# Patient Record
Sex: Female | Born: 2000 | Race: White | Hispanic: No | Marital: Single | Smoking: Never smoker
Health system: Southern US, Community
[De-identification: ages and names within clinical notes are randomized; demographics above are authoritative.]

## PROBLEM LIST (undated history)

## (undated) DIAGNOSIS — K5090 Crohn's disease, unspecified, without complications: Secondary | ICD-10-CM

---
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
## 2021-07-09 IMAGING — CT CT ABD-PELV W/ CM
2 of 4 series · 16 of 46 positions shown, 18 images · IV contrast (Omnipaque)
Comparison: None.

CLINICAL DATA: Blood in stool

EXAM:
CT ABDOMEN AND PELVIS WITH CONTRAST
TECHNIQUE: Multidetector CT imaging of the abdomen and pelvis was performed
using the standard protocol following bolus administration of
intravenous contrast.
CONTRAST:  100mL OMNIPAQUE IOHEXOL 300 MG/ML  SOLN

[Series 2: axial st · axial · 0.80mm/px · z∈[-466,-26]mm · 13 of 98 slices shown, 15 images]
[im 5/98  soft-tissue]
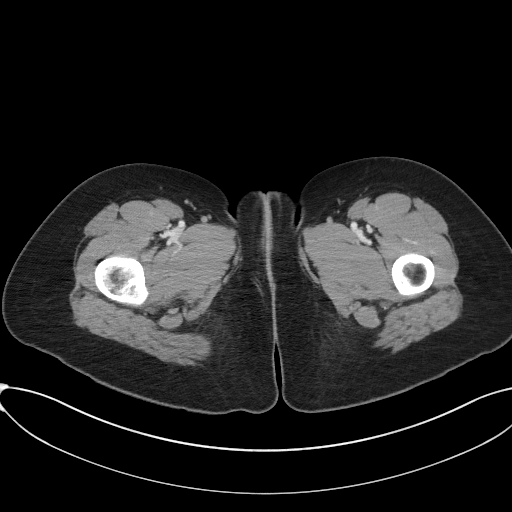
[im 5/98  bone]
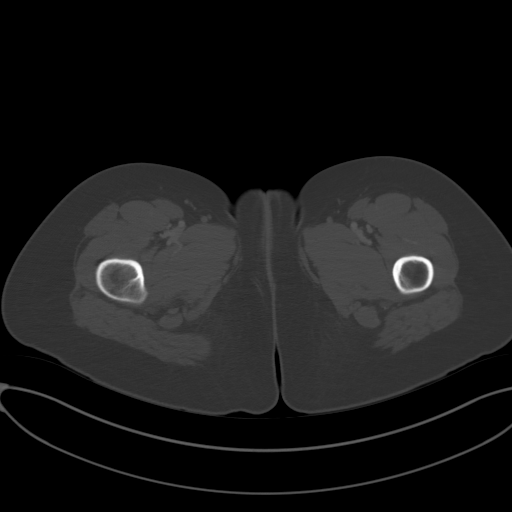
[im 13/98  soft-tissue]
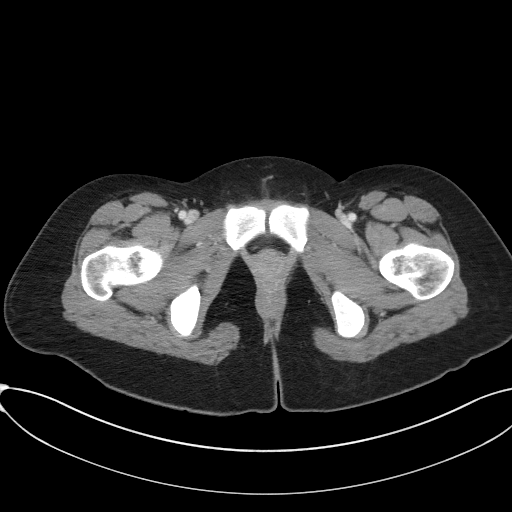
[im 22/98  soft-tissue]
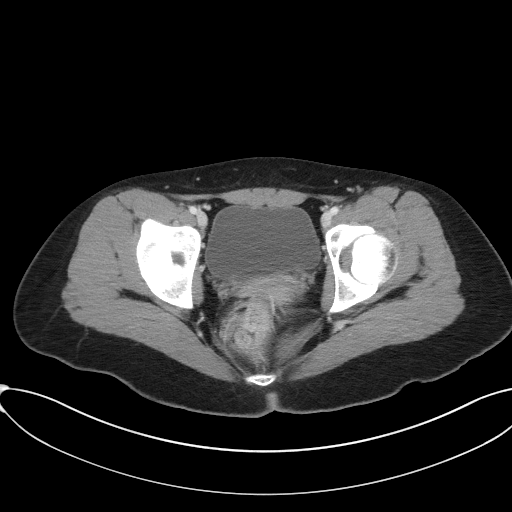
[im 26/98  soft-tissue]
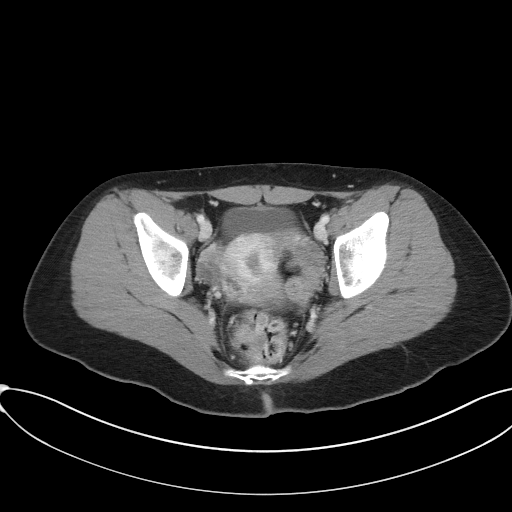
[im 34/98  soft-tissue]
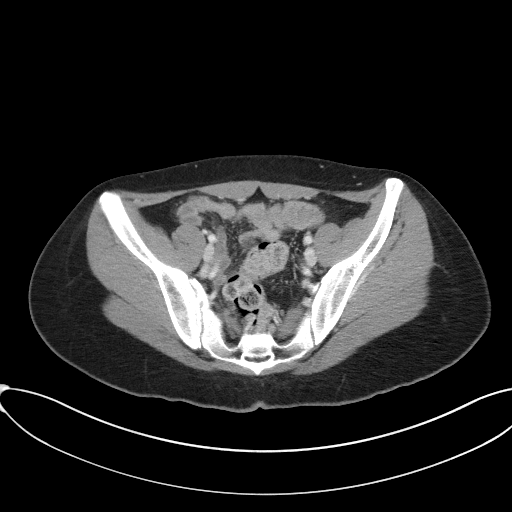
[im 43/98  soft-tissue]
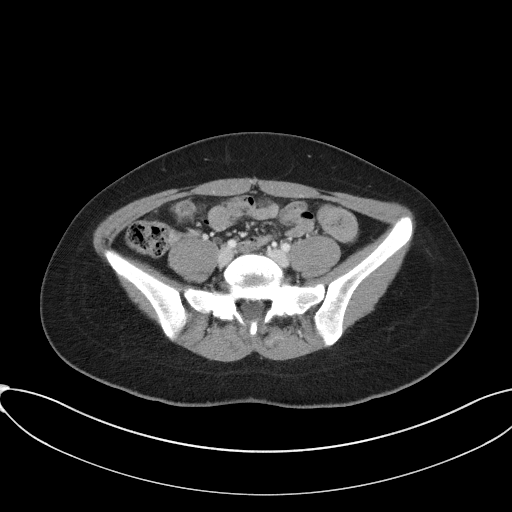
[im 51/98  soft-tissue]
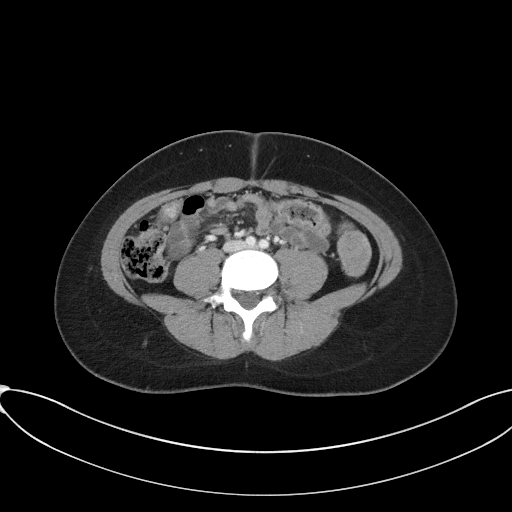
[im 55/98  soft-tissue]
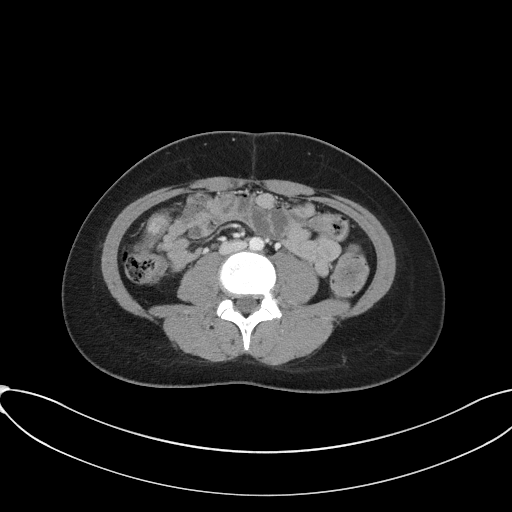
[im 64/98  soft-tissue]
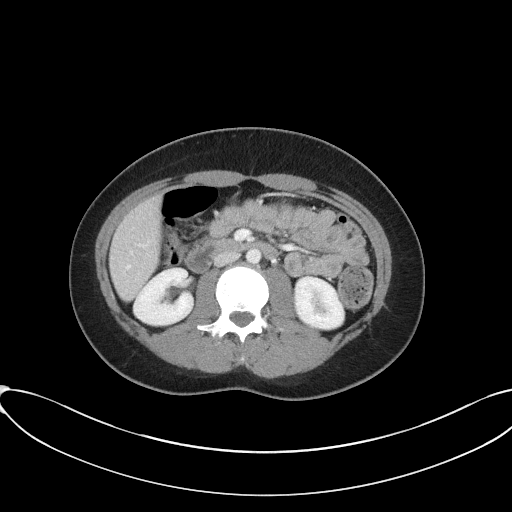
[im 64/98  bone]
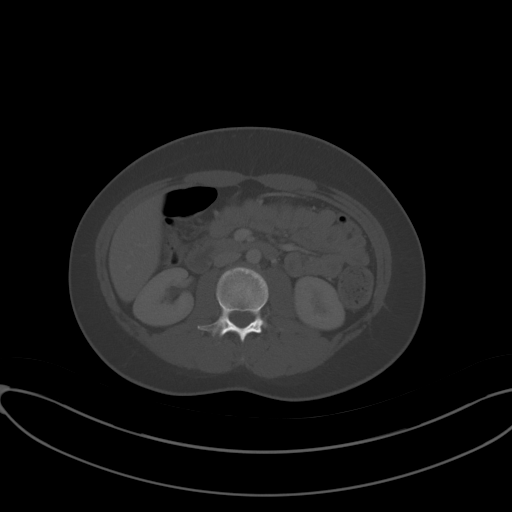
[im 72/98  soft-tissue]
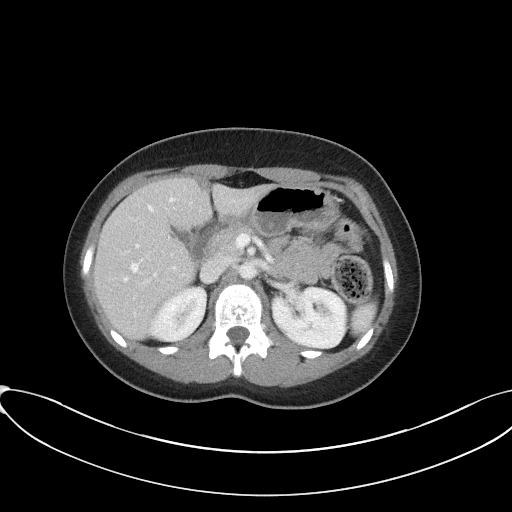
[im 76/98  soft-tissue]
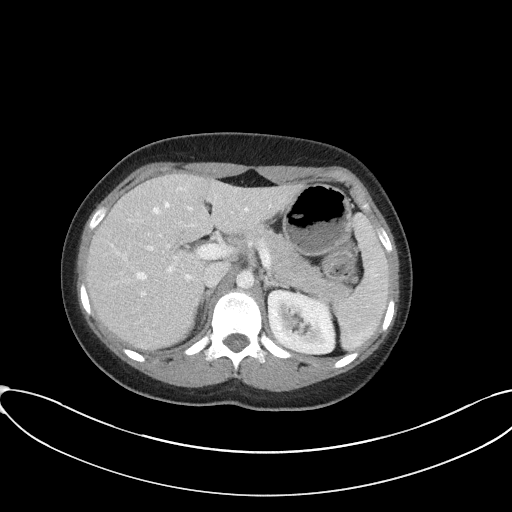
[im 85/98  soft-tissue]
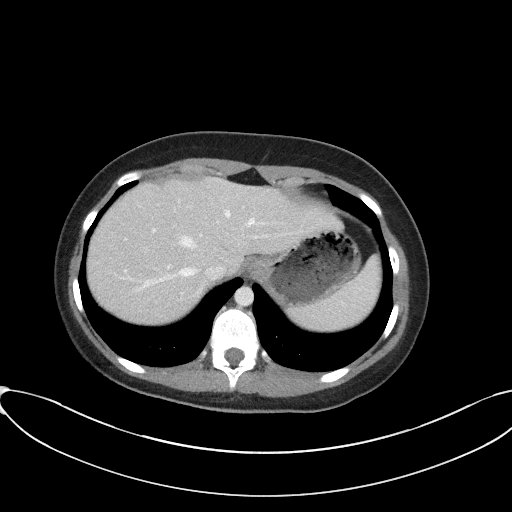
[im 93/98  soft-tissue]
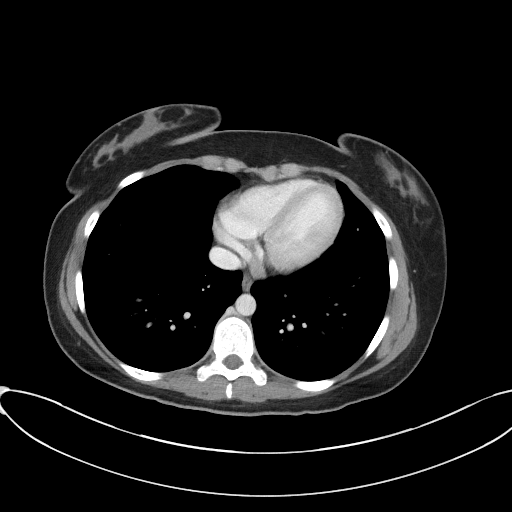

[Series 5: coronal st · coronal · 0.75mm/px · 3 of 77 slices shown]
[im 26/77  soft-tissue]
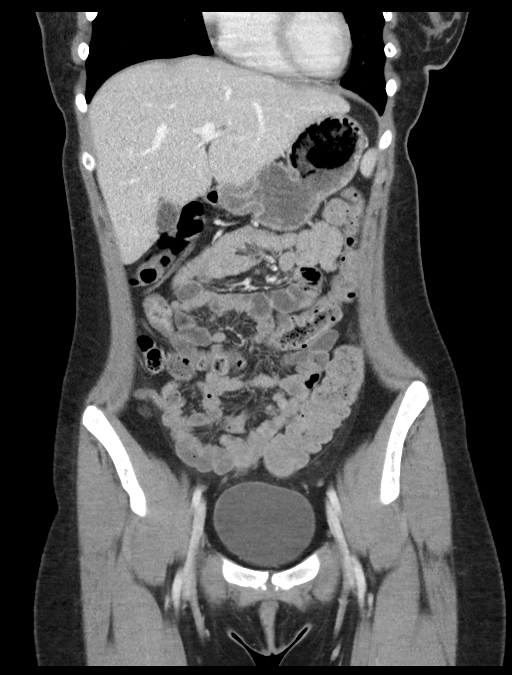
[im 34/77  soft-tissue]
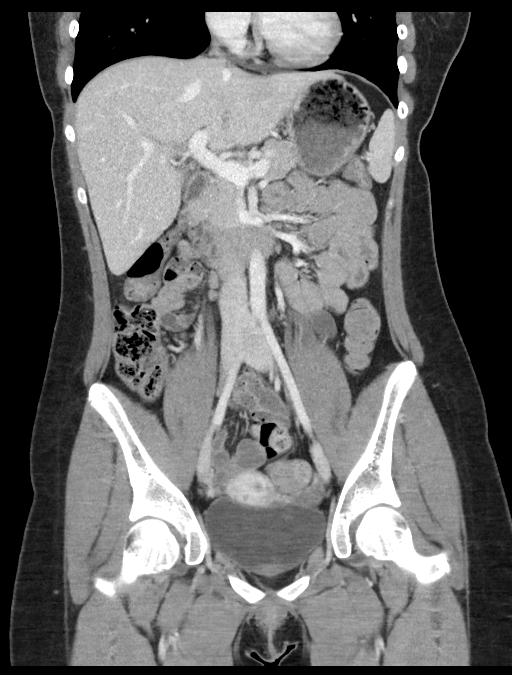
[im 43/77  soft-tissue]
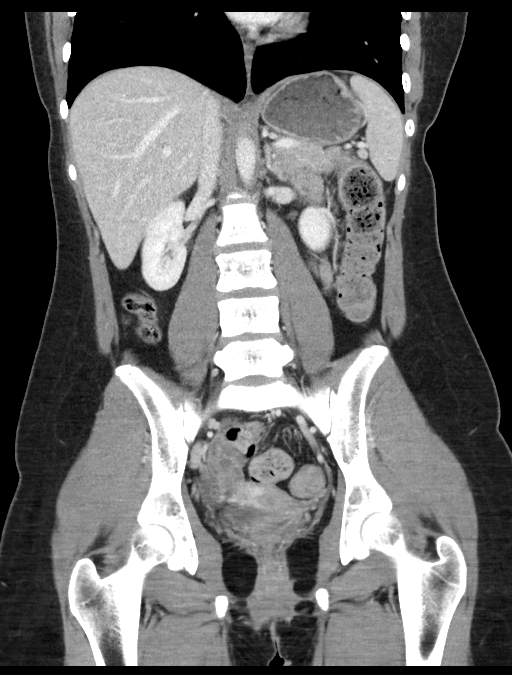

[16 of 46 positions shown; findings below may reference images not displayed]

FINDINGS: Lower chest: Lung bases demonstrate no acute consolidation or
effusion. The heart size is within normal limits.

Hepatobiliary: Focal fat infiltration near the falciform ligament.
No calcified gallstone or biliary dilatation

Pancreas: Unremarkable. No pancreatic ductal dilatation or
surrounding inflammatory changes.

Spleen: Normal in size without focal abnormality.

Adrenals/Urinary Tract: Adrenal glands are unremarkable. Kidneys are
normal, without renal calculi, focal lesion, or hydronephrosis.
Bladder is unremarkable.

Stomach/Bowel: The stomach is nonenlarged. No dilated small bowel.
Questionable areas mild colon wall thickening and mucosal
enhancement at the proximal transverse colon and splenic flexure.
Equivocal appearance of appendix. Appendix measures up to 8 mm in
size with some mucosal enhancement noted but no definitive
inflammatory changes or fluid collection.

Vascular/Lymphatic: No significant vascular findings are present. No
enlarged abdominal or pelvic lymph nodes.

Reproductive: Uterus and bilateral adnexa are unremarkable.

Other: Negative for free air or free fluid.

Musculoskeletal: No acute or significant osseous findings.
IMPRESSION: 1. Questionable mild colitis type changes at the proximal transverse
colon and splenic flexure.
2. Equivocal appearance of the appendix. Appendix is borderline
enlarged with some mucosal enhancement but without other definitive
signs for surrounding inflammatory change/appendicitis. Correlate
with appropriate laboratory values and clinical presentation.

## 2023-12-14 ENCOUNTER — Other Ambulatory Visit (HOSPITAL_BASED_OUTPATIENT_CLINIC_OR_DEPARTMENT_OTHER): Payer: Self-pay

## 2023-12-14 MED ORDER — ESCITALOPRAM OXALATE 10 MG PO TABS
10.0000 mg | ORAL_TABLET | Freq: Every day | ORAL | 1 refills | Status: AC
  Filled 2023-12-14: qty 30, 30d supply, fill #0
  Filled 2024-01-03 – 2024-01-04 (×3): qty 30, 30d supply, fill #1

## 2023-12-17 ENCOUNTER — Ambulatory Visit
Admission: EM | Admit: 2023-12-17 | Discharge: 2023-12-17 | Disposition: A | Discharge status: Home / Self Care | Attending: Family Medicine | Admitting: Family Medicine

## 2023-12-17 ENCOUNTER — Encounter (HOSPITAL_BASED_OUTPATIENT_CLINIC_OR_DEPARTMENT_OTHER): Payer: Self-pay | Admitting: Emergency Medicine

## 2023-12-17 ENCOUNTER — Other Ambulatory Visit: Payer: Self-pay

## 2023-12-17 ENCOUNTER — Emergency Department (HOSPITAL_BASED_OUTPATIENT_CLINIC_OR_DEPARTMENT_OTHER): Admission: EM | Admit: 2023-12-17 | Discharge: 2023-12-17 | Disposition: A | Discharge status: Home / Self Care

## 2023-12-17 DIAGNOSIS — H938X2 Other specified disorders of left ear: Secondary | ICD-10-CM | POA: Diagnosis present

## 2023-12-17 DIAGNOSIS — H6992 Unspecified Eustachian tube disorder, left ear: Secondary | ICD-10-CM

## 2023-12-17 MED ORDER — FLUTICASONE PROPIONATE 50 MCG/ACT NA SUSP: 1.0000 | Freq: Every day | NASAL | 0 refills | Status: AC

## 2023-12-17 MED ORDER — CETIRIZINE HCL 10 MG PO TABS: 10.0000 mg | ORAL_TABLET | Freq: Every day | ORAL | 0 refills | Status: AC

## 2023-12-17 NOTE — Discharge Instructions (Addendum)
 Start Zyrtec daily for 7 to 14 days.  You may use Flonase daily as well.  Please follow-up with your PCP or ear nose and throat if your symptoms do not improve.  Please go to the ER for any worsening symptoms.  Hope you feel better soon!

## 2023-12-17 NOTE — ED Triage Notes (Signed)
 Left ear fullness and discomfort since march.  Pt has attempted to clean her ear and has gone to UC but symptoms continue.

## 2023-12-17 NOTE — ED Provider Notes (Signed)
 [facility] Provider Note   CSN: [id number] Arrival date & time: 12/17/23  1451     History  Chief Complaint  Patient presents with   Ear Fullness    Erica Hickman is a 23 y.o. female.  23 year old female with no reported past medical history presenting to the emergency department today with ear fullness in her left ear.  The patient states that this has been going on now for the past few weeks.  She states that she did use a Q-tip to clean her ears a few weeks ago and states that she started having this full sensation afterwards.  States that she does have some muffled hearing on that side as well.  She came to the ER at that time for further evaluation.  She denies any fevers.  She went to urgent care earlier and was prescribed allergy medications.  Who is coming here for further evaluation.  She does not live in the area and is visiting for the weekend.  Is planning on home on Monday.   Ear Fullness       Home Medications Prior to Admission medications   Medication Sig Start Date End Date Taking? Authorizing Provider  cetirizine (ZYRTEC) 10 MG tablet Take 1 tablet (10 mg total) by mouth daily. 12/17/23   Mayer, Jodi R, NP  ciprofloxacin  (CIPRO ) 500 MG tablet Take 1 tablet (500 mg total) by mouth every 12 (twelve) hours. 10/30/19   Layden, Lindsey A, PA-C  diphenhydrAMINE  (BENADRYL ) 25 MG tablet Take 1 to 2 tablets by mouth every 6 hours as needed for itching. 11/15/20   Wynetta Heckle, MD  escitalopram (LEXAPRO) 10 MG tablet Take by mouth daily.    [provider]  escitalopram (LEXAPRO) 10 MG tablet Take 1 tablet (10 mg total) by mouth daily. 12/13/23     fluticasone (FLONASE) 50 MCG/ACT nasal spray Place 1 spray into both nostrils daily. 12/17/23   Mayer, Jodi R, NP  metroNIDAZOLE  (FLAGYL ) 500 MG tablet Take 1 tablet (500 mg total) by mouth 2 (two) times daily. 10/30/19   Layden, Lindsey A, PA-C  omeprazole (PRILOSEC) 40 MG  capsule Take 40 mg by mouth daily.    [provider]  predniSONE  (DELTASONE ) 20 MG tablet Take 3 tablets by mouth daily for 3 days, then 2 tablets for 3 days, then 1 & 1/2 tablets for 3 days, then 1 tablet for 3 days, then 1/2 tablet x 3 days 11/15/20   Wynetta Heckle, MD  STELARA 90 MG/ML SOSY injection Inject into the skin. 10/31/20   [provider]      Allergies    Penicillins    Review of Systems   Review of Systems  HENT:         Ear fullness    Physical Exam Updated Vital Signs BP (!) 127/95 (BP Location: Left Arm)   Pulse 79   Temp 98.2 F (36.8 C) (Oral)   Resp 18   Ht 5\' 6"  (1.676 m)   Wt 56.7 kg   LMP 12/11/2023   SpO2 100%   BMI 20.18 kg/m  Physical Exam Vitals and nursing note reviewed.   Gen: NAD HEENT: Appearing TMs bilaterally with exception of questionable scarring noted over the inferior aspect of the left TM, there is no erythema, dullness, or effusion to suggest infection, no earwax is noted  ED Results / Procedures / Treatments   Labs (all labs ordered are listed, but only abnormal results are  displayed) Labs Reviewed - No data to display  EKG None  Radiology No results found.  Procedures Procedures    Medications Ordered in ED Medications - No data to display  ED Course/ Medical Decision Making/ A&P                                 Medical Decision Making 23 year old female presenting to the emergency department today with left ear fullness after using a Q-tip to clean her ears a few weeks ago.  Her ears appear normal bilaterally.  With this been going on for weeks I think that she is stable for follow-up with ENT.  I do not appreciate an obvious infection.  The inferior portion of the left TM does appear abnormal.  Unclear if this is due to possible scarring or injury from the Q-tip.  There is not appear to be a secondary infection so I think that she is stable to follow-up with ENT as an outpatient.  She is discharged  with return precautions.  She is offered follow-up here in [place] but will not be here into next week so she will follow-up with an ENT when she gets home.           Final Clinical Impression(s) / ED Diagnoses Final diagnoses:  Ear fullness, left    Rx / DC Orders ED Discharge Orders     None         Carin Charleston, MD 12/17/23 1553

## 2023-12-17 NOTE — ED Notes (Signed)
 Pt not visualized in room when this RN attempted to provide d/c paperwork. Not visualized on the unit.

## 2023-12-17 NOTE — ED Triage Notes (Signed)
 Pt states she used a q-tip to clean her left ear and felt the q-tip push the wax deeper in ear and now has pain in left ear.

## 2023-12-17 NOTE — Discharge Instructions (Signed)
 I do not appreciate any signs of infection or significant earwax in your ear.  The lower portion of your left eardrum does appear slightly abnormal.  I am unclear if this is due to scarring or a small injury to your left eardrum.  Please follow-up with an ear nose and throat doctor when you get back home.  Return to the ER for fevers or worsening symptoms.

## 2023-12-17 NOTE — ED Provider Notes (Addendum)
 [facility]    CSN: [id number] Arrival date & time: 12/17/23  1355      History   Chief Complaint No chief complaint on file.   HPI Erica Hickman is a 23 y.o. female presents for left ear pain.  Patient reports a month ago she used a Q-tip to clean out her ear and thinks she pushed wax further in and has since been having intermittent pain to the left ear.  Denies any drainage, hearing changes, URI or allergy symptoms.  No other concerns at this time.  HPI  Past Medical History:  Diagnosis Date   Crohn's disease (HCC)     There are no active problems to display for this patient.   History reviewed. No pertinent surgical history.  OB History   No obstetric history on file.      Home Medications    Prior to Admission medications   Medication Sig Start Date End Date Taking? Authorizing Provider  cetirizine (ZYRTEC) 10 MG tablet Take 1 tablet (10 mg total) by mouth daily. 12/17/23  Yes Welborn Keena, Jodi R, NP  fluticasone (FLONASE) 50 MCG/ACT nasal spray Place 1 spray into both nostrils daily. 12/17/23  Yes Leevon Upperman, Jodi R, NP  ciprofloxacin  (CIPRO ) 500 MG tablet Take 1 tablet (500 mg total) by mouth every 12 (twelve) hours. 10/30/19   Layden, Lindsey A, PA-C  diphenhydrAMINE  (BENADRYL ) 25 MG tablet Take 1 to 2 tablets by mouth every 6 hours as needed for itching. 11/15/20   Wynetta Heckle, MD  escitalopram (LEXAPRO) 10 MG tablet Take by mouth daily.    [provider]  escitalopram (LEXAPRO) 10 MG tablet Take 1 tablet (10 mg total) by mouth daily. 12/13/23     metroNIDAZOLE  (FLAGYL ) 500 MG tablet Take 1 tablet (500 mg total) by mouth 2 (two) times daily. 10/30/19   Layden, Lindsey A, PA-C  omeprazole (PRILOSEC) 40 MG capsule Take 40 mg by mouth daily.    [provider]  predniSONE  (DELTASONE ) 20 MG tablet Take 3 tablets by mouth daily for 3 days, then 2 tablets for 3 days, then 1 & 1/2 tablets for 3 days, then 1 tablet for 3 days, then 1/2 tablet x 3 days  11/15/20   Wynetta Heckle, MD  STELARA 90 MG/ML SOSY injection Inject into the skin. 10/31/20   [provider]    Family History History reviewed. No pertinent family history.  Social History Social History   Tobacco Use   Smoking status: Never   Smokeless tobacco: Never  Substance Use Topics   Alcohol use: Never   Drug use: Never     Allergies   Penicillins   Review of Systems Review of Systems  HENT:  Positive for ear pain.      Physical Exam Triage Vital Signs ED Triage Vitals  Encounter Vitals Group     BP 12/17/23 1408 (!) 139/98     Systolic BP Percentile --      Diastolic BP Percentile --      Pulse Rate 12/17/23 1408 89     Resp 12/17/23 1408 18     Temp 12/17/23 1408 98.2 F (36.8 C)     Temp Source 12/17/23 1408 Oral     SpO2 12/17/23 1408 97 %     Weight --      Height --      Head Circumference --      Peak Flow --      Pain Score 12/17/23 1414 1  Pain Loc --      Pain Education --      Exclude from Growth Chart --    No data found.  Updated Vital Signs BP (!) 139/98   Pulse 89   Temp 98.2 F (36.8 C) (Oral)   Resp 18   LMP 12/11/2023   SpO2 97%   Visual Acuity Right Eye Distance:   Left Eye Distance:   Bilateral Distance:    Right Eye Near:   Left Eye Near:    Bilateral Near:     Physical Exam Vitals and nursing note reviewed.  Constitutional:      General: She is not in acute distress.    Appearance: Normal appearance. She is not ill-appearing.  HENT:     Head: Normocephalic and atraumatic.     Right Ear: Tympanic membrane and ear canal normal. There is no impacted cerumen.     Left Ear: Ear canal normal. A middle ear effusion is present. There is no impacted cerumen.  Eyes:     Pupils: Pupils are equal, round, and reactive to light.  Cardiovascular:     Rate and Rhythm: Normal rate.  Pulmonary:     Effort: Pulmonary effort is normal.  Skin:    General: Skin is warm and dry.  Neurological:     General:  No focal deficit present.     Mental Status: She is alert and oriented to person, place, and time.  Psychiatric:        Mood and Affect: Mood normal.        Behavior: Behavior normal.      UC Treatments / Results  Labs (all labs ordered are listed, but only abnormal results are displayed) Labs Reviewed - No data to display  EKG   Radiology No results found.  Procedures Procedures (including critical care time)  Medications Ordered in UC Medications - No data to display  Initial Impression / Assessment and Plan / UC Course  I have reviewed the triage vital signs and the nursing notes.  Pertinent labs & imaging results that were available during my care of the patient were reviewed by me and considered in my medical decision making (see chart for details).     Reviewed exam and symptoms with patient.  No red flags.  Bilateral canals are clear with no cerumen impaction.  Patient appeared to be upset by this and had her mother call the clinic to express their dissatisfaction.  I again reviewed with her there is no wax in her ears and therefore no indication for ear lavage.  Discussed eustachian tube dysfunction.  Start Zyrtec and Flonase.  PCP or ENT follow-up if symptoms improve.  ER precautions reviewed and patient verbalized understanding. Final Clinical Impressions(s) / UC Diagnoses   Final diagnoses:  Eustachian tube dysfunction, left     Discharge Instructions      Start Zyrtec daily for 7 to 14 days.  You may use Flonase daily as well.  Please follow-up with your PCP or ear nose and throat if your symptoms do not improve.  Please go to the ER for any worsening symptoms.  Hope you feel better soon!  ED Prescriptions     Medication Sig Dispense Auth. Provider   cetirizine (ZYRTEC) 10 MG tablet Take 1 tablet (10 mg total) by mouth daily. 14 tablet Brieanne Mignone, Jodi R, NP   fluticasone (FLONASE) 50 MCG/ACT nasal spray Place 1 spray into both nostrils daily. 15.8 mL Durinda Buzzelli,  Jodi R, NP  PDMP not reviewed this encounter.   Alleen Arbour, NP 12/17/23 1434    Alleen Arbour, NP 12/17/23 [phone]

## 2024-01-03 ENCOUNTER — Encounter (HOSPITAL_BASED_OUTPATIENT_CLINIC_OR_DEPARTMENT_OTHER): Payer: Self-pay

## 2024-01-03 ENCOUNTER — Other Ambulatory Visit (HOSPITAL_COMMUNITY): Payer: Self-pay

## 2024-01-03 ENCOUNTER — Other Ambulatory Visit (HOSPITAL_BASED_OUTPATIENT_CLINIC_OR_DEPARTMENT_OTHER): Payer: Self-pay

## 2024-01-04 ENCOUNTER — Other Ambulatory Visit (HOSPITAL_BASED_OUTPATIENT_CLINIC_OR_DEPARTMENT_OTHER): Payer: Self-pay

## 2024-01-04 ENCOUNTER — Other Ambulatory Visit (HOSPITAL_COMMUNITY): Payer: Self-pay

## 2024-01-05 ENCOUNTER — Other Ambulatory Visit: Payer: Self-pay

## 2024-01-05 ENCOUNTER — Encounter: Payer: Self-pay | Admitting: Pharmacist

## 2024-01-05 ENCOUNTER — Other Ambulatory Visit (HOSPITAL_BASED_OUTPATIENT_CLINIC_OR_DEPARTMENT_OTHER): Payer: Self-pay

## 2024-01-05 ENCOUNTER — Other Ambulatory Visit (HOSPITAL_COMMUNITY): Payer: Self-pay

## 2024-01-06 ENCOUNTER — Other Ambulatory Visit: Payer: Self-pay

## 2024-01-06 ENCOUNTER — Other Ambulatory Visit (HOSPITAL_BASED_OUTPATIENT_CLINIC_OR_DEPARTMENT_OTHER): Payer: Self-pay
